# Patient Record
Sex: Female | Born: 1975 | Race: Black or African American | Hispanic: No | Marital: Single | Smoking: Never smoker
Health system: Southern US, Community
[De-identification: ages and names within clinical notes are randomized; demographics above are authoritative.]

## PROBLEM LIST (undated history)

## (undated) DIAGNOSIS — E7800 Pure hypercholesterolemia, unspecified: Secondary | ICD-10-CM

## (undated) DIAGNOSIS — F419 Anxiety disorder, unspecified: Secondary | ICD-10-CM

## (undated) DIAGNOSIS — I10 Essential (primary) hypertension: Secondary | ICD-10-CM

## (undated) DIAGNOSIS — Z889 Allergy status to unspecified drugs, medicaments and biological substances status: Secondary | ICD-10-CM

## (undated) DIAGNOSIS — T783XXA Angioneurotic edema, initial encounter: Secondary | ICD-10-CM

## (undated) HISTORY — DX: Morbid (severe) obesity due to excess calories: E66.01

## (undated) HISTORY — DX: Angioneurotic edema, initial encounter: T78.3XXA

## (undated) HISTORY — DX: Pure hypercholesterolemia, unspecified: E78.00

## (undated) HISTORY — DX: Allergy status to unspecified drugs, medicaments and biological substances: Z88.9

## (undated) HISTORY — DX: Essential (primary) hypertension: I10

## (undated) HISTORY — DX: Anxiety disorder, unspecified: F41.9

---

## 2011-06-01 ENCOUNTER — Other Ambulatory Visit: Payer: Self-pay

## 2011-06-01 ENCOUNTER — Emergency Department (HOSPITAL_COMMUNITY)
Admission: EM | Admit: 2011-06-01 | Discharge: 2011-06-02 | Disposition: A | Discharge status: Home / Self Care | Payer: Self-pay | Attending: Emergency Medicine | Admitting: Emergency Medicine

## 2011-06-01 ENCOUNTER — Encounter: Payer: Self-pay | Admitting: Emergency Medicine

## 2011-06-01 DIAGNOSIS — R079 Chest pain, unspecified: Secondary | ICD-10-CM | POA: Insufficient documentation

## 2011-06-01 NOTE — ED Notes (Signed)
Pt states having chest tightness and sob onset Friday. Pt states pain has increased . Has had panic attacks in past but states this feels different.

## 2011-06-02 NOTE — ED Provider Notes (Signed)
History     CSN: [id number] Arrival date & time: 06/01/2011  5:49 PM   First MD Initiated Contact with Patient 06/01/11 2348      Chief Complaint  Patient presents with  . Chest Pain    (Consider location/radiation/quality/duration/timing/severity/associated sxs/prior treatment) Patient is a 35 y.o. female presenting with chest pain. The history is provided by the patient.  Chest Pain Episode onset: 4 days ago. Chest pain occurs intermittently. The chest pain is unchanged. The pain is associated with breathing. The severity of the pain is mild. The quality of the pain is described as aching. The pain does not radiate. Exacerbated by: No known provocation. Pertinent negatives for primary symptoms include no fever, no fatigue, no syncope, no shortness of breath, no cough, no wheezing, no nausea, no vomiting and no dizziness.  Pertinent negatives for associated symptoms include no diaphoresis, no numbness and no orthopnea. She tried nothing for the symptoms. Risk factors include no known risk factors (no oral contraceptives).  Pertinent negatives for past medical history include no PE.     Past Medical History  Diagnosis Date  . Asthma     Past Surgical History  Procedure Date  . Cesarean section     History reviewed. No pertinent family history.  History  Substance Use Topics  . Smoking status: Not on file  . Smokeless tobacco: Not on file  . Alcohol Use: No    OB History    Grav Para Term Preterm Abortions TAB SAB Ect Mult Living                  Review of Systems  Constitutional: Negative for fever, diaphoresis and fatigue.  Respiratory: Negative for cough, shortness of breath and wheezing.   Cardiovascular: Positive for chest pain. Negative for orthopnea and syncope.  Gastrointestinal: Negative for nausea and vomiting.  Neurological: Negative for dizziness and numbness.  All other systems reviewed and are negative.    Allergies  Review of patient's  allergies indicates no known allergies.  Home Medications   Current Outpatient Rx  Name Route Sig Dispense Refill  . IBUPROFEN 200 MG PO TABS Oral Take 400 mg by mouth every 6 (six) hours as needed. For pain.       BP 129/72  Pulse 81  Temp 98.2 F (36.8 C)  Resp 28  SpO2 98%  LMP 05/28/2011  Physical Exam  Constitutional: She is oriented to person, place, and time. She appears well-developed and well-nourished.  HENT:  Head: Normocephalic and atraumatic.  Eyes: EOM are normal. Pupils are equal, round, and reactive to light.  Neck: Normal range of motion. Neck supple.  Cardiovascular: Normal rate and regular rhythm.   Pulmonary/Chest: Effort normal and breath sounds normal.       Chest nontender to palpation  Abdominal: Soft. Bowel sounds are normal.  Musculoskeletal: Normal range of motion.  Neurological: She is alert and oriented to person, place, and time. No cranial nerve deficit. She exhibits normal muscle tone.  Skin: Skin is warm and dry.  Psychiatric: She has a normal mood and affect. Her behavior is normal. Judgment and thought content normal.    ED Course  Procedures (including critical care time)  Labs Reviewed - No data to display No results found.   1. Chest pain       MDM  Nonspecific chest pain, pertinent negative, no GI symptoms. Doubt PE, pneumonia, metabolic instability or occult infection.        Flint Melter, MD  06/02/11 0008 

## 2016-10-06 ENCOUNTER — Encounter: Payer: Self-pay | Admitting: Allergy and Immunology

## 2016-10-06 ENCOUNTER — Ambulatory Visit (INDEPENDENT_AMBULATORY_CARE_PROVIDER_SITE_OTHER): Payer: BLUE CROSS/BLUE SHIELD | Admitting: Allergy and Immunology

## 2016-10-06 VITALS — BP 152/92 | HR 88 | Temp 98.6°F | Resp 18 | Ht 67.0 in | Wt 268.0 lb

## 2016-10-06 DIAGNOSIS — H1010 Acute atopic conjunctivitis, unspecified eye: Secondary | ICD-10-CM | POA: Diagnosis not present

## 2016-10-06 DIAGNOSIS — J309 Allergic rhinitis, unspecified: Secondary | ICD-10-CM | POA: Diagnosis not present

## 2016-10-06 DIAGNOSIS — T7840XA Allergy, unspecified, initial encounter: Secondary | ICD-10-CM

## 2016-10-06 DIAGNOSIS — L500 Allergic urticaria: Secondary | ICD-10-CM

## 2016-10-06 MED ORDER — EPINEPHRINE 0.3 MG/0.3ML IJ SOAJ: 0.3000 mg | Freq: Once | INTRAMUSCULAR | 2 refills | Status: AC

## 2016-10-06 MED ORDER — MONTELUKAST SODIUM 10 MG PO TABS: 10.0000 mg | ORAL_TABLET | Freq: Every day | ORAL | 5 refills | Status: DC

## 2016-10-06 MED ORDER — MOMETASONE FUROATE 0.1 % EX OINT: TOPICAL_OINTMENT | Freq: Every day | CUTANEOUS | 5 refills | Status: AC

## 2016-10-06 NOTE — Progress Notes (Signed)
NEW PATIENT NOTE  Referring Provider: No ref. provider found Primary Provider: No PCP Per Patient Date of office visit: 10/06/2016    Subjective:   Chief Complaint:  Peggy Foster (DOB: 06/05/1976) is a 41 y.o. female who presents to the clinic on 10/06/2016 with a chief complaint of Allergic Reaction (on 09/25/16); Angioedema; and Rash .  HPI: Peggy Foster presents to this clinic in evaluation of a allergic reaction that occurred on 09/25/2016. Apparently she woke up that morning with very significant swelling of her face mostly involving her periorbital region and her lips. In association with the swelling was a very red body and intense itching of her body. She also had a flare of her eczema involving her neck and she developed scale around her eyes. Her reaction lasted approximately 2 days for her facial swelling and her red body remained for approximately 5 days and she is still little bit itchy today.Marland Kitchen She went to the urgent care center that day and was treated with an injection and prednisone.  The day prior to her reaction she had diarrhea. She had at least 8 episodes of diarrhea and her stomach was just upset in general without any nausea or vomiting. She did not have any fever associated with this issue. She actually developed a little bit of itching the night before her reaction and she took some Benadryl and then went to sleep and then subsequently ended up with her reaction the next morning.  There was no other associated systemic or constitutional symptoms. There was no obvious trigger giving rise to this issue.  She does have a history of eczema that intermittently flares involving her arms and neck that she treats with over-the-counter hydrocortisone which is not helping very much. As noted above she did have a flare of her neck eczema with her most recent allergic reaction. She also has a history of springtime nasal congestion and runny nose that has responsed quite well to the  use of Zyrtec. She has a very distant history of childhood asthma but has not used a bronchodilator in many many years.  Past Medical History:  Diagnosis Date  . Angio-edema   . Asthma   . H/O seasonal allergies     Past Surgical History:  Procedure Laterality Date  . CESAREAN SECTION      Allergies as of 10/06/2016      Reactions   Ciprofloxacin Hives, Nausea And Vomiting      Medication List      cetirizine 10 MG tablet Commonly known as:  ZYRTEC Take 10 mg by mouth daily.       Review of systems negative except as noted in HPI / PMHx or noted below:  Review of Systems  Constitutional: Negative.   HENT: Negative.   Eyes: Negative.   Respiratory: Negative.   Cardiovascular: Negative.   Gastrointestinal: Negative.   Genitourinary: Negative.   Musculoskeletal: Negative.   Skin: Negative.   Neurological: Negative.   Endo/Heme/Allergies: Negative.   Psychiatric/Behavioral: Negative.     Family History  Problem Relation Age of Onset  . Hypertension Mother   . Hypertension Father   . Asthma Maternal Grandmother   . Asthma Paternal Grandfather   . Diabetes Paternal Grandfather     Social History   Social History  . Marital status: Single    Spouse name: N/A  . Number of children: N/A  . Years of education: N/A   Occupational History  . Not on file.  Social History Main Topics  . Smoking status: Never Smoker  . Smokeless tobacco: Never Used  . Alcohol use No  . Drug use: No  . Sexual activity: Not on file   Other Topics Concern  . Not on file   Social History Narrative  . No narrative on file    Environmental and Social history  Lives in a house with a dry environment, no animals located inside the household, carpeting in the bedroom, plastic on the bed but not the pillow, and no smokers located inside the household. She works in a Teacher, adult education at [employer].  Objective:   Vitals:   10/06/16 0847  BP: (!) 152/92  Pulse: 88  Resp: 18  Temp:  98.6 F (37 C)   Height: 5\' 7"  (170.2 cm) Weight: 268 lb (121.6 kg)  Physical Exam  Constitutional: She is well-developed, well-nourished, and in no distress.  HENT:  Head: Normocephalic. Head is without right periorbital erythema and without left periorbital erythema.  Right Ear: Tympanic membrane, external ear and ear canal normal.  Left Ear: Tympanic membrane, external ear and ear canal normal.  Nose: Nose normal. No mucosal edema or rhinorrhea.  Mouth/Throat: Uvula is midline, oropharynx is clear and moist and mucous membranes are normal. No oropharyngeal exudate.  Eyes: Conjunctivae and lids are normal. Pupils are equal, round, and reactive to light.  Neck: Trachea normal. No tracheal tenderness present. No tracheal deviation present. No thyromegaly present.  Cardiovascular: Normal rate, regular rhythm, S1 normal, S2 normal and normal heart sounds.   No murmur heard. Pulmonary/Chest: Effort normal and breath sounds normal. No stridor. No tachypnea. No respiratory distress. She has no wheezes. She has no rales. She exhibits no tenderness.  Abdominal: Soft. She exhibits no distension and no mass. There is no hepatosplenomegaly. There is no tenderness. There is no rebound and no guarding.  Musculoskeletal: She exhibits no edema or tenderness.  Lymphadenopathy:       Head (right side): No tonsillar adenopathy present.       Head (left side): No tonsillar adenopathy present.    She has no cervical adenopathy.    She has no axillary adenopathy.  Neurological: She is alert. Gait normal.  Skin: Rash (scaly, lichenified, erythematous skin posterior neck and arms bilaterally) noted. She is not diaphoretic. No erythema. No pallor. Nails show no clubbing.  Psychiatric: Mood and affect normal.    Diagnostics: Allergy skin tests were performed. She demonstrated hypersensitivity to cat, dog, and tree pollen. She did not demonstrate any hypersensitivity to a screening panel of  foods.  Assessment and Plan:    1. Allergic reaction, initial encounter   2. Allergic urticaria   3. Allergic rhinoconjunctivitis     1. Allergen avoidance measures  2. AUVI-Q 0.3, Benadryl, M.D./ER evaluation for allergic reaction  3. For the next 4 weeks utilize the following medications:   A. cetirizine 10 mg one time per day  B. montelukast 10 mg one time per day  4. If needed:   A. mometasone 0.1% ointment applied to eczema one time per day   5. Blood - alpha gal panel, CBC w/Diff, CMP  6. Further evaluation and treatment? Yes, if recurrent  7. Return to clinic summer 2018 or earlier if problem.   The etiology of Merrell's allergic reaction is not entirely clear. This may be based upon atopic disease and one of the triggers giving rise to this reaction may have been exposure to tree pollen but is well she did have diarrhea  the day preceding her reaction and her immunological hyperreactivity may have been triggered off by an infectious disease residing in her gut. For now we will have her perform allergen avoidance measures and consistently use a H1 receptor blocker and a leukotriene modifier and have her obtain blood tests looking at major organ function and possible alpha gal syndrome. For her atopic dermatitis she can use topical steroids. She'll keep in contact with me noting her response to this approach. I'll see her back in this clinic in the summer or earlier if there is a problem.  Allena Katz, MD Allergy / Immunology [place]

## 2016-10-06 NOTE — Patient Instructions (Addendum)
  1. Allergen avoidance measures  2. AUVI-Q 0.3, Benadryl, M.D./ER evaluation for allergic reaction  3. For the next 4 weeks utilize the following medications:   A. cetirizine 10 mg one time per day  B. montelukast 10 mg one time per day  4. If needed:   A. mometasone 0.1% ointment applied to eczema one time per day   5. Blood - alpha gal panel, CBC w/Diff, CMP  6. Further evaluation and treatment? Yes, if recurrent  7. Return to clinic summer 2018 or earlier if problem.

## 2017-12-16 ENCOUNTER — Encounter: Payer: Self-pay | Admitting: Family Medicine

## 2017-12-16 ENCOUNTER — Ambulatory Visit (INDEPENDENT_AMBULATORY_CARE_PROVIDER_SITE_OTHER): Payer: BLUE CROSS/BLUE SHIELD | Admitting: Family Medicine

## 2017-12-16 VITALS — BP 144/96 | HR 88 | Temp 99.3°F | Resp 16 | Ht 68.0 in | Wt 269.6 lb

## 2017-12-16 DIAGNOSIS — F419 Anxiety disorder, unspecified: Secondary | ICD-10-CM | POA: Diagnosis not present

## 2017-12-16 DIAGNOSIS — R030 Elevated blood-pressure reading, without diagnosis of hypertension: Secondary | ICD-10-CM

## 2017-12-16 DIAGNOSIS — Z8639 Personal history of other endocrine, nutritional and metabolic disease: Secondary | ICD-10-CM

## 2017-12-16 MED ORDER — LISINOPRIL-HYDROCHLOROTHIAZIDE 10-12.5 MG PO TABS: 1.0000 | ORAL_TABLET | Freq: Every day | ORAL | 1 refills | Status: DC

## 2017-12-16 MED ORDER — SERTRALINE HCL 50 MG PO TABS: 50.0000 mg | ORAL_TABLET | Freq: Every day | ORAL | 1 refills | Status: DC

## 2017-12-16 MED ORDER — ALPRAZOLAM 0.25 MG PO TABS
0.2500 mg | ORAL_TABLET | Freq: Two times a day (BID) | ORAL | 0 refills | Status: AC | PRN
Start: 2017-12-16 — End: ?

## 2017-12-16 NOTE — Patient Instructions (Signed)
Start the sertraline 1/2 tablet (25 mg) for the first week. Increase to the full tablet (50 mg) after that if you are doing well.  Take the Xanax if needed. Do not drink alcohol with these medications.   Start the BP medication once daily. Eat a low sodium diet.  Check your BP at home daily and keep a record of the readings. Goal BP is <130/80.  Bring in your BP machine and readings in 2 weeks.    DASH Eating Plan DASH stands for "Dietary Approaches to Stop Hypertension." The DASH eating plan is a healthy eating plan that has been shown to reduce high blood pressure (hypertension). It may also reduce your risk for type 2 diabetes, heart disease, and stroke. The DASH eating plan may also help with weight loss. What are tips for following this plan? General guidelines  Avoid eating more than 2,300 mg (milligrams) of salt (sodium) a day. If you have hypertension, you may need to reduce your sodium intake to 1,500 mg a day.  Limit alcohol intake to no more than 1 drink a day for nonpregnant women and 2 drinks a day for men. One drink equals 12 oz of beer, 5 oz of wine, or 1 oz of hard liquor.  Work with your health care provider to maintain a healthy body weight or to lose weight. Ask what an ideal weight is for you.  Get at least 30 minutes of exercise that causes your heart to beat faster (aerobic exercise) most days of the week. Activities may include walking, swimming, or biking.  Work with your health care provider or diet and nutrition specialist (dietitian) to adjust your eating plan to your individual calorie needs. Reading food labels  Check food labels for the amount of sodium per serving. Choose foods with less than 5 percent of the Daily Value of sodium. Generally, foods with less than 300 mg of sodium per serving fit into this eating plan.  To find whole grains, look for the word "whole" as the first word in the ingredient list. Shopping  Buy products labeled as "low-sodium" or  "no salt added."  Buy fresh foods. Avoid canned foods and premade or frozen meals. Cooking  Avoid adding salt when cooking. Use salt-free seasonings or herbs instead of table salt or sea salt. Check with your health care provider or pharmacist before using salt substitutes.  Do not fry foods. Cook foods using healthy methods such as baking, boiling, grilling, and broiling instead.  Cook with heart-healthy oils, such as olive, canola, soybean, or sunflower oil. Meal planning   Eat a balanced diet that includes: ? 5 or more servings of fruits and vegetables each day. At each meal, try to fill half of your plate with fruits and vegetables. ? Up to 6-8 servings of whole grains each day. ? Less than 6 oz of lean meat, poultry, or fish each day. A 3-oz serving of meat is about the same size as a deck of cards. One egg equals 1 oz. ? 2 servings of low-fat dairy each day. ? A serving of nuts, seeds, or beans 5 times each week. ? Heart-healthy fats. Healthy fats called Omega-3 fatty acids are found in foods such as flaxseeds and coldwater fish, like sardines, salmon, and mackerel.  Limit how much you eat of the following: ? Canned or prepackaged foods. ? Food that is high in trans fat, such as fried foods. ? Food that is high in saturated fat, such as fatty meat. ?  Sweets, desserts, sugary drinks, and other foods with added sugar. ? Full-fat dairy products.  Do not salt foods before eating.  Try to eat at least 2 vegetarian meals each week.  Eat more home-cooked food and less restaurant, buffet, and fast food.  When eating at a restaurant, ask that your food be prepared with less salt or no salt, if possible. What foods are recommended? The items listed may not be a complete list. Talk with your dietitian about what dietary choices are best for you. Grains Whole-grain or whole-wheat bread. Whole-grain or whole-wheat pasta. Brown rice. Modena Morrow. Bulgur. Whole-grain and low-sodium  cereals. Pita bread. Low-fat, low-sodium crackers. Whole-wheat flour tortillas. Vegetables Fresh or frozen vegetables (raw, steamed, roasted, or grilled). Low-sodium or reduced-sodium tomato and vegetable juice. Low-sodium or reduced-sodium tomato sauce and tomato paste. Low-sodium or reduced-sodium canned vegetables. Fruits All fresh, dried, or frozen fruit. Canned fruit in natural juice (without added sugar). Meat and other protein foods Skinless chicken or [place]. Ground chicken or [place]. Pork with fat trimmed off. Fish and seafood. Egg whites. Dried beans, peas, or lentils. Unsalted nuts, nut butters, and seeds. Unsalted canned beans. Lean cuts of beef with fat trimmed off. Low-sodium, lean deli meat. Dairy Low-fat (1%) or fat-free (skim) milk. Fat-free, low-fat, or reduced-fat cheeses. Nonfat, low-sodium ricotta or cottage cheese. Low-fat or nonfat yogurt. Low-fat, low-sodium cheese. Fats and oils Soft margarine without trans fats. Vegetable oil. Low-fat, reduced-fat, or light mayonnaise and salad dressings (reduced-sodium). Canola, safflower, olive, soybean, and sunflower oils. Avocado. Seasoning and other foods Herbs. Spices. Seasoning mixes without salt. Unsalted popcorn and pretzels. Fat-free sweets. What foods are not recommended? The items listed may not be a complete list. Talk with your dietitian about what dietary choices are best for you. Grains Baked goods made with fat, such as croissants, muffins, or some breads. Dry pasta or rice meal packs. Vegetables Creamed or fried vegetables. Vegetables in a cheese sauce. Regular canned vegetables (not low-sodium or reduced-sodium). Regular canned tomato sauce and paste (not low-sodium or reduced-sodium). Regular tomato and vegetable juice (not low-sodium or reduced-sodium). Angie Fava. Olives. Fruits Canned fruit in a light or heavy syrup. Fried fruit. Fruit in cream or butter sauce. Meat and other protein foods Fatty cuts of meat. Ribs.  Fried meat. Berniece Salines. Sausage. Bologna and other processed lunch meats. Salami. Fatback. Hotdogs. Bratwurst. Salted nuts and seeds. Canned beans with added salt. Canned or smoked fish. Whole eggs or egg yolks. Chicken or [place] with skin. Dairy Whole or 2% milk, cream, and half-and-half. Whole or full-fat cream cheese. Whole-fat or sweetened yogurt. Full-fat cheese. Nondairy creamers. Whipped toppings. Processed cheese and cheese spreads. Fats and oils Butter. Stick margarine. Lard. Shortening. Ghee. Bacon fat. Tropical oils, such as coconut, palm kernel, or palm oil. Seasoning and other foods Salted popcorn and pretzels. Onion salt, garlic salt, seasoned salt, table salt, and sea salt. Worcestershire sauce. Tartar sauce. Barbecue sauce. Teriyaki sauce. Soy sauce, including reduced-sodium. Steak sauce. Canned and packaged gravies. Fish sauce. Oyster sauce. Cocktail sauce. Horseradish that you find on the shelf. Ketchup. Mustard. Meat flavorings and tenderizers. Bouillon cubes. Hot sauce and Tabasco sauce. Premade or packaged marinades. Premade or packaged taco seasonings. Relishes. Regular salad dressings. Where to find more information:  National Heart, Lung, and [place]: https://wilson-eaton.com/  American Heart Association: www.heart.org Summary  The DASH eating plan is a healthy eating plan that has been shown to reduce high blood pressure (hypertension). It may also reduce your risk for type 2 diabetes,  heart disease, and stroke.  With the DASH eating plan, you should limit salt (sodium) intake to 2,300 mg a day. If you have hypertension, you may need to reduce your sodium intake to 1,500 mg a day.  When on the DASH eating plan, aim to eat more fresh fruits and vegetables, whole grains, lean proteins, low-fat dairy, and heart-healthy fats.  Work with your health care provider or diet and nutrition specialist (dietitian) to adjust your eating plan to your individual calorie needs. This  information is not intended to replace advice given to you by your health care provider. Make sure you discuss any questions you have with your health care provider. Document Released: 05/28/2011 Document Revised: 06/01/2016 Document Reviewed: 06/01/2016 Elsevier Interactive Patient Education  [employer].

## 2017-12-16 NOTE — Progress Notes (Signed)
   Subjective:    Patient ID: Peggy Foster, female    DOB: [birth year], 42 y.o.   MRN: [id number]  HPI Chief Complaint  Patient presents with  . NP    NP elevated BP   She is new to the practice and here to establish care.  She lived in [place] prior to moving here.  Previous medical care: Dr. Criss Rosales   States she has been concerned about her BP being elevated for the past year or so. She does not check it at home. Has never been on medication.  Family history of HTN in mother in her brother.   Complains of anxiety attacks and having intermittent episodes of dizziness, heart racing, palms sweating, increased breathing that has been ongoing for a couple of years.  States she has been evaluated for chest pain before and told that her symptoms were related to anxiety. Has been prescribed Xanax in the past and states this improved her symptoms.  She is requesting help with her anxiety. States it is affecting her job an her home life.   Denies smoking, drug use, or acohol.   Works at [employer]. Single, has one child. Supportive mother.   Denies fever, chills, vision changes, chest pain, shortness of breath, cough, orthopnea, abdominal pain, back pain, N/V/D, urinary symptoms, LE edema.   Reviewed allergies, medications, past medical, surgical, family, and social history.   Review of Systems Pertinent positives and negatives in the history of present illness.     Objective:   Physical Exam BP (!) 144/96   Pulse 88   Temp 99.3 F (37.4 C) (Oral)   Resp 16   Ht 5\' 8"  (1.727 m)   Wt 269 lb 9.6 oz (122.3 kg)   SpO2 98%   BMI 40.99 kg/m   Alert and in no distress. Pharyngeal area is normal. Neck is supple without adenopathy or thyromegaly. Cardiac exam shows a regular sinus rhythm without murmurs or gallops. Lungs are clear to auscultation. PERRLA, EOMs intact, CN intact, extremities without edema, normal pulses. Skin is warm and dry, no pallor.        Assessment & Plan:    Anxiety - Plan: sertraline (ZOLOFT) 50 MG tablet, ALPRAZolam (XANAX) 0.25 MG tablet, CBC with Differential/Platelet, Comprehensive metabolic panel, TSH  Elevated blood pressure reading without diagnosis of hypertension - Plan: lisinopril-hydrochlorothiazide (PRINZIDE,ZESTORETIC) 10-12.5 MG tablet, CBC with Differential/Platelet, Comprehensive metabolic panel, TSH  Morbid obesity ([place]) - Plan: CBC with Differential/Platelet, Comprehensive metabolic panel, TSH, Hemoglobin A1c  History of hyperlipidemia - Plan: Lipid panel  Pleasant appearing female in no acute distress. She is anxious appearing and tearful. She would like to try medication and counseling for anxiety.  Sertraline and alprazolam prescribed. She is aware that alprazolam is for short term treatment only and that she may need this during the first the week or two.  She will call and schedule with a counselor.  Blood pressure is elevated and I will start her on medication. She will buy a BP cuff and start checking her BP at home. Return in 2 weeks with machine, readings and follow up on anxiety as well.  Handout given on DASH diet.  Check lipid panel due to history of hyperlipidemia.

## 2017-12-17 LAB — HEMOGLOBIN A1C
Est. average glucose Bld gHb Est-mCnc: 114 mg/dL
HEMOGLOBIN A1C: 5.6 % (ref 4.8–5.6)

## 2017-12-17 LAB — CBC WITH DIFFERENTIAL/PLATELET
BASOS: 0 %
Basophils Absolute: 0.1 10*3/uL (ref 0.0–0.2)
EOS (ABSOLUTE): 0.2 10*3/uL (ref 0.0–0.4)
EOS: 1 %
HEMATOCRIT: 38.3 % (ref 34.0–46.6)
HEMOGLOBIN: 12.6 g/dL (ref 11.1–15.9)
Immature Grans (Abs): 0.1 10*3/uL (ref 0.0–0.1)
Immature Granulocytes: 1 %
Lymphocytes Absolute: 3.5 10*3/uL — ABNORMAL HIGH (ref 0.7–3.1)
Lymphs: 27 %
MCH: 27.1 pg (ref 26.6–33.0)
MCHC: 32.9 g/dL (ref 31.5–35.7)
MCV: 82 fL (ref 79–97)
MONOCYTES: 6 %
Monocytes Absolute: 0.7 10*3/uL (ref 0.1–0.9)
NEUTROS ABS: 8.6 10*3/uL — AB (ref 1.4–7.0)
Neutrophils: 65 %
Platelets: 556 10*3/uL — ABNORMAL HIGH (ref 150–450)
RBC: 4.65 x10E6/uL (ref 3.77–5.28)
RDW: 14.2 % (ref 12.3–15.4)
WBC: 13.1 10*3/uL — ABNORMAL HIGH (ref 3.4–10.8)

## 2017-12-17 LAB — LIPID PANEL
Chol/HDL Ratio: 4 ratio (ref 0.0–4.4)
Cholesterol, Total: 179 mg/dL (ref 100–199)
HDL: 45 mg/dL (ref >39)
LDL Calculated: 118 mg/dL — ABNORMAL HIGH (ref 0–99)
Triglycerides: 80 mg/dL (ref 0–149)
VLDL CHOLESTEROL CAL: 16 mg/dL (ref 5–40)

## 2017-12-17 LAB — COMPREHENSIVE METABOLIC PANEL
ALBUMIN: 4.5 g/dL (ref 3.5–5.5)
ALK PHOS: 109 IU/L (ref 39–117)
ALT: 15 IU/L (ref 0–32)
AST: 18 IU/L (ref 0–40)
Albumin/Globulin Ratio: 1.3 (ref 1.2–2.2)
BILIRUBIN TOTAL: 0.2 mg/dL (ref 0.0–1.2)
BUN / CREAT RATIO: 11 (ref 9–23)
BUN: 8 mg/dL (ref 6–24)
CO2: 21 mmol/L (ref 20–29)
CREATININE: 0.71 mg/dL (ref 0.57–1.00)
Calcium: 9.5 mg/dL (ref 8.7–10.2)
Chloride: 107 mmol/L — ABNORMAL HIGH (ref 96–106)
GFR calc Af Amer: 123 mL/min/{1.73_m2} (ref >59)
GFR calc non Af Amer: 107 mL/min/{1.73_m2} (ref >59)
GLOBULIN, TOTAL: 3.4 g/dL (ref 1.5–4.5)
GLUCOSE: 90 mg/dL (ref 65–99)
Potassium: 4.1 mmol/L (ref 3.5–5.2)
Sodium: 137 mmol/L (ref 134–144)
TOTAL PROTEIN: 7.9 g/dL (ref 6.0–8.5)

## 2017-12-17 LAB — TSH: TSH: 0.942 u[IU]/mL (ref 0.450–4.500)

## 2017-12-29 ENCOUNTER — Encounter: Payer: Self-pay | Admitting: Family Medicine

## 2017-12-29 ENCOUNTER — Ambulatory Visit (INDEPENDENT_AMBULATORY_CARE_PROVIDER_SITE_OTHER): Payer: BLUE CROSS/BLUE SHIELD | Admitting: Family Medicine

## 2017-12-29 VITALS — BP 150/90 | HR 90 | Wt 266.0 lb

## 2017-12-29 DIAGNOSIS — E7800 Pure hypercholesterolemia, unspecified: Secondary | ICD-10-CM

## 2017-12-29 DIAGNOSIS — D473 Essential (hemorrhagic) thrombocythemia: Secondary | ICD-10-CM | POA: Diagnosis not present

## 2017-12-29 DIAGNOSIS — I10 Essential (primary) hypertension: Secondary | ICD-10-CM | POA: Diagnosis not present

## 2017-12-29 DIAGNOSIS — D75839 Thrombocytosis, unspecified: Secondary | ICD-10-CM

## 2017-12-29 DIAGNOSIS — F419 Anxiety disorder, unspecified: Secondary | ICD-10-CM

## 2017-12-29 HISTORY — DX: Anxiety disorder, unspecified: F41.9

## 2017-12-29 HISTORY — DX: Pure hypercholesterolemia, unspecified: E78.00

## 2017-12-29 HISTORY — DX: Essential (primary) hypertension: I10

## 2017-12-29 HISTORY — DX: Morbid (severe) obesity due to excess calories: E66.01

## 2017-12-29 NOTE — Patient Instructions (Signed)
Continue monitoring your BP at home. Report back your average readings in 2 weeks.

## 2017-12-29 NOTE — Progress Notes (Signed)
   Subjective:    Patient ID: Peggy Foster, female    DOB: 07-17-1975, 42 y.o.   MRN: [id number]  HPI Chief Complaint  Patient presents with  . 2 week follow-up    2 week follow-up on bp and aniexty. bp running lowest- 113/ 87  highest-147/106.   She is here to follow up on HTN and anxiety.  Her WBC count and platelets were elevated 2 weeks ago as well.   Started her on lisinopril-HCTZ 2 weeks ago. This is the first time she has taken medication for HTN. Reports history of high BP for the past year or longer.  Checking her BP at home and seeing readings as low as 119/77, 121/75  Diet - cooking more and eating out less Exercise- walking more Family history of HTN.   Diabetes screening was negative.   She was having anxiety attacks so we started her on sertraline and she has noticed significant improvement.  Is not taking alprazolam regularly - has only needed this a couple of times.   Has not scheduled to see a counselor   Depression screen [facility] 2/9 12/29/2017  Decreased Interest 0  Down, Depressed, Hopeless 0  PHQ - 2 Score 0   Denies fever, chills, dizziness, chest pain, palpitations, shortness of breath, abdominal pain, N/V/D, urinary symptoms, LE edema.   Reviewed allergies, medications, past medical, surgical, family, and social history.    Review of Systems Pertinent positives and negatives in the history of present illness.     Objective:   Physical Exam BP (!) 150/90 Comment: office cuff  Pulse 90   Wt 266 lb (120.7 kg)   BMI 40.45 kg/m   Alert and oriented and in no acute distress.       Assessment & Plan:  Essential hypertension  Anxiety  Morbid obesity (HCC)  Elevated LDL cholesterol level  Thrombocytosis (HCC) - Plan: CBC with Differential/Platelet  Her blood pressure is improving on medication.  We will give this more time.  Counseling again on eating a low-sodium diet and increasing physical activity. She will continue to keep an eye on  her blood pressure at home.  Anxiety appears to be much improved on sertraline. Encouraged her to call and schedule a counseling appointment. Counseling done on improving cholesterol including diet and lifestyle Unclear etiology for elevated white blood count and thrombocytosis 2 weeks ago.  She does report that she was feeling ill the week prior to having her labs checked.  We will recheck this today. Asked her to send me blood pressure readings again in 2 weeks.  Follow-up here if readings are not closer to goal range.

## 2017-12-30 LAB — CBC WITH DIFFERENTIAL/PLATELET
Basophils Absolute: 0 10*3/uL (ref 0.0–0.2)
Basos: 0 %
EOS (ABSOLUTE): 0.1 10*3/uL (ref 0.0–0.4)
Eos: 1 %
Hematocrit: 36.5 % (ref 34.0–46.6)
Hemoglobin: 12.2 g/dL (ref 11.1–15.9)
IMMATURE GRANS (ABS): 0 10*3/uL (ref 0.0–0.1)
IMMATURE GRANULOCYTES: 0 %
LYMPHS: 25 %
Lymphocytes Absolute: 2.4 10*3/uL (ref 0.7–3.1)
MCH: 27.7 pg (ref 26.6–33.0)
MCHC: 33.4 g/dL (ref 31.5–35.7)
MCV: 83 fL (ref 79–97)
Monocytes Absolute: 0.6 10*3/uL (ref 0.1–0.9)
Monocytes: 6 %
NEUTROS PCT: 68 %
Neutrophils Absolute: 6.5 10*3/uL (ref 1.4–7.0)
Platelets: 460 10*3/uL — ABNORMAL HIGH (ref 150–450)
RBC: 4.41 x10E6/uL (ref 3.77–5.28)
RDW: 15.1 % (ref 12.3–15.4)
WBC: 9.7 10*3/uL (ref 3.4–10.8)

## 2018-01-11 ENCOUNTER — Encounter: Payer: Self-pay | Admitting: Family Medicine

## 2018-02-08 ENCOUNTER — Other Ambulatory Visit: Payer: Self-pay | Admitting: Family Medicine

## 2018-02-08 DIAGNOSIS — R030 Elevated blood-pressure reading, without diagnosis of hypertension: Secondary | ICD-10-CM

## 2018-02-08 DIAGNOSIS — F419 Anxiety disorder, unspecified: Secondary | ICD-10-CM

## 2018-02-23 ENCOUNTER — Telehealth: Payer: Self-pay | Admitting: Family Medicine

## 2018-02-23 ENCOUNTER — Encounter: Payer: Self-pay | Admitting: Family Medicine

## 2018-02-23 ENCOUNTER — Ambulatory Visit (INDEPENDENT_AMBULATORY_CARE_PROVIDER_SITE_OTHER): Payer: BLUE CROSS/BLUE SHIELD | Admitting: Family Medicine

## 2018-02-23 VITALS — BP 140/90 | HR 81 | Wt 270.4 lb

## 2018-02-23 DIAGNOSIS — F419 Anxiety disorder, unspecified: Secondary | ICD-10-CM | POA: Diagnosis not present

## 2018-02-23 DIAGNOSIS — R05 Cough: Secondary | ICD-10-CM | POA: Diagnosis not present

## 2018-02-23 DIAGNOSIS — R058 Other specified cough: Secondary | ICD-10-CM

## 2018-02-23 DIAGNOSIS — I10 Essential (primary) hypertension: Secondary | ICD-10-CM | POA: Diagnosis not present

## 2018-02-23 DIAGNOSIS — T464X5A Adverse effect of angiotensin-converting-enzyme inhibitors, initial encounter: Secondary | ICD-10-CM

## 2018-02-23 MED ORDER — LOSARTAN POTASSIUM-HCTZ 50-12.5 MG PO TABS: 1.0000 | ORAL_TABLET | Freq: Every day | ORAL | 2 refills | Status: DC

## 2018-02-23 NOTE — Telephone Encounter (Signed)
  Fax from [facility] concerning Losartan/hct 50-12.5 They want to break into 2 separate Rx's due to medication being on backorder

## 2018-02-23 NOTE — Patient Instructions (Addendum)
Stop the lisinopril-HCTZ due to cough. Your cough should resolve in the next couple of weeks.  Start on Losartan-HCTZ for your blood pressure. Continue checking it at home. Call or message me in 2 weeks and give me your readings.  Return in 4 weeks for a physical and blood pressure follow up.   Start using the free app called My Fitness Pal. Cut back on portion sizes. Eat only one small plate and wait at least 10 minutes before going back for seconds.   Increase your physical activity.   You will receive a call from [employer] about their upcoming weight loss study.      DASH Eating Plan DASH stands for "Dietary Approaches to Stop Hypertension." The DASH eating plan is a healthy eating plan that has been shown to reduce high blood pressure (hypertension). It may also reduce your risk for type 2 diabetes, heart disease, and stroke. The DASH eating plan may also help with weight loss. What are tips for following this plan? General guidelines  Avoid eating more than 2,300 mg (milligrams) of salt (sodium) a day. If you have hypertension, you may need to reduce your sodium intake to 1,500 mg a day.  Limit alcohol intake to no more than 1 drink a day for nonpregnant women and 2 drinks a day for men. One drink equals 12 oz of beer, 5 oz of wine, or 1 oz of hard liquor.  Work with your health care provider to maintain a healthy body weight or to lose weight. Ask what an ideal weight is for you.  Get at least 30 minutes of exercise that causes your heart to beat faster (aerobic exercise) most days of the week. Activities may include walking, swimming, or biking.  Work with your health care provider or diet and nutrition specialist (dietitian) to adjust your eating plan to your individual calorie needs. Reading food labels  Check food labels for the amount of sodium per serving. Choose foods with less than 5 percent of the Daily Value of sodium. Generally, foods with less than 300 mg of sodium  per serving fit into this eating plan.  To find whole grains, look for the word "whole" as the first word in the ingredient list. Shopping  Buy products labeled as "low-sodium" or "no salt added."  Buy fresh foods. Avoid canned foods and premade or frozen meals. Cooking  Avoid adding salt when cooking. Use salt-free seasonings or herbs instead of table salt or sea salt. Check with your health care provider or pharmacist before using salt substitutes.  Do not fry foods. Cook foods using healthy methods such as baking, boiling, grilling, and broiling instead.  Cook with heart-healthy oils, such as olive, canola, soybean, or sunflower oil. Meal planning   Eat a balanced diet that includes: ? 5 or more servings of fruits and vegetables each day. At each meal, try to fill half of your plate with fruits and vegetables. ? Up to 6-8 servings of whole grains each day. ? Less than 6 oz of lean meat, poultry, or fish each day. A 3-oz serving of meat is about the same size as a deck of cards. One egg equals 1 oz. ? 2 servings of low-fat dairy each day. ? A serving of nuts, seeds, or beans 5 times each week. ? Heart-healthy fats. Healthy fats called Omega-3 fatty acids are found in foods such as flaxseeds and coldwater fish, like sardines, salmon, and mackerel.  Limit how much you eat of the following: ?  Canned or prepackaged foods. ? Food that is high in trans fat, such as fried foods. ? Food that is high in saturated fat, such as fatty meat. ? Sweets, desserts, sugary drinks, and other foods with added sugar. ? Full-fat dairy products.  Do not salt foods before eating.  Try to eat at least 2 vegetarian meals each week.  Eat more home-cooked food and less restaurant, buffet, and fast food.  When eating at a restaurant, ask that your food be prepared with less salt or no salt, if possible. What foods are recommended? The items listed may not be a complete list. Talk with your dietitian  about what dietary choices are best for you. Grains Whole-grain or whole-wheat bread. Whole-grain or whole-wheat pasta. Brown rice. [place] Morrow. Bulgur. Whole-grain and low-sodium cereals. Pita bread. Low-fat, low-sodium crackers. Whole-wheat flour tortillas. Vegetables Fresh or frozen vegetables (raw, steamed, roasted, or grilled). Low-sodium or reduced-sodium tomato and vegetable juice. Low-sodium or reduced-sodium tomato sauce and tomato paste. Low-sodium or reduced-sodium canned vegetables. Fruits All fresh, dried, or frozen fruit. Canned fruit in natural juice (without added sugar). Meat and other protein foods Skinless chicken or [place]. Ground chicken or [place]. Pork with fat trimmed off. Fish and seafood. Egg whites. Dried beans, peas, or lentils. Unsalted nuts, nut butters, and seeds. Unsalted canned beans. Lean cuts of beef with fat trimmed off. Low-sodium, lean deli meat. Dairy Low-fat (1%) or fat-free (skim) milk. Fat-free, low-fat, or reduced-fat cheeses. Nonfat, low-sodium ricotta or cottage cheese. Low-fat or nonfat yogurt. Low-fat, low-sodium cheese. Fats and oils Soft margarine without trans fats. Vegetable oil. Low-fat, reduced-fat, or light mayonnaise and salad dressings (reduced-sodium). Canola, safflower, olive, soybean, and sunflower oils. Avocado. Seasoning and other foods Herbs. Spices. Seasoning mixes without salt. Unsalted popcorn and pretzels. Fat-free sweets. What foods are not recommended? The items listed may not be a complete list. Talk with your dietitian about what dietary choices are best for you. Grains Baked goods made with fat, such as croissants, muffins, or some breads. Dry pasta or rice meal packs. Vegetables Creamed or fried vegetables. Vegetables in a cheese sauce. Regular canned vegetables (not low-sodium or reduced-sodium). Regular canned tomato sauce and paste (not low-sodium or reduced-sodium). Regular tomato and vegetable juice (not low-sodium or  reduced-sodium). Angie Fava. Olives. Fruits Canned fruit in a light or heavy syrup. Fried fruit. Fruit in cream or butter sauce. Meat and other protein foods Fatty cuts of meat. Ribs. Fried meat. Berniece Salines. Sausage. Bologna and other processed lunch meats. Salami. Fatback. Hotdogs. Bratwurst. Salted nuts and seeds. Canned beans with added salt. Canned or smoked fish. Whole eggs or egg yolks. Chicken or [place] with skin. Dairy Whole or 2% milk, cream, and half-and-half. Whole or full-fat cream cheese. Whole-fat or sweetened yogurt. Full-fat cheese. Nondairy creamers. Whipped toppings. Processed cheese and cheese spreads. Fats and oils Butter. Stick margarine. Lard. Shortening. Ghee. Bacon fat. Tropical oils, such as coconut, palm kernel, or palm oil. Seasoning and other foods Salted popcorn and pretzels. Onion salt, garlic salt, seasoned salt, table salt, and sea salt. Worcestershire sauce. Tartar sauce. Barbecue sauce. Teriyaki sauce. Soy sauce, including reduced-sodium. Steak sauce. Canned and packaged gravies. Fish sauce. Oyster sauce. Cocktail sauce. Horseradish that you find on the shelf. Ketchup. Mustard. Meat flavorings and tenderizers. Bouillon cubes. Hot sauce and Tabasco sauce. Premade or packaged marinades. Premade or packaged taco seasonings. Relishes. Regular salad dressings. Where to find more information:  National Heart, Lung, and Reading: https://wilson-eaton.com/  American Heart Association: www.heart.org Summary  The DASH eating plan is a healthy eating plan that has been shown to reduce high blood pressure (hypertension). It may also reduce your risk for type 2 diabetes, heart disease, and stroke.  With the DASH eating plan, you should limit salt (sodium) intake to 2,300 mg a day. If you have hypertension, you may need to reduce your sodium intake to 1,500 mg a day.  When on the DASH eating plan, aim to eat more fresh fruits and vegetables, whole grains, lean proteins, low-fat  dairy, and heart-healthy fats.  Work with your health care provider or diet and nutrition specialist (dietitian) to adjust your eating plan to your individual calorie needs. This information is not intended to replace advice given to you by your health care provider. Make sure you discuss any questions you have with your health care provider. Document Released: 05/28/2011 Document Revised: 06/01/2016 Document Reviewed: 06/01/2016 Elsevier Interactive Patient Education  [employer].

## 2018-02-23 NOTE — Progress Notes (Signed)
   Subjective:    Patient ID: Peggy Foster, female    DOB: [birth year], 42 y.o.   MRN: [id number]  HPI Chief Complaint  Patient presents with  . 8 week follow-up    8 week follow-up on htn and anxiety   She is here for a 2 month follow up on HTN and anxiety.   Reports good compliance with Lisinopril-HCTZ. States she has a horrible dry cough that started in early August.  Denies having any sneezing, itchy watery eyes or any other URI symptoms. No fever, chills, chest pain, DOE.  BP at home has been 130s-150s/ 80-90.   Diet is poor.  Exercise- none   She is interested in weight loss.  Started on sertraline and states she is "doing great".  Anxiety is not an issue for her any longer.  Reviewed allergies, medications, past medical, surgical, family, and social history.    Review of Systems Pertinent positives and negatives in the history of present illness.     Objective:   Physical Exam BP 140/90   Pulse 81   Wt 270 lb 6.4 oz (122.7 kg)   BMI 41.11 kg/m   Alert and in no distress.  Pharyngeal area is normal. Neck is supple without adenopathy or thyromegaly. Cardiac exam shows a regular sinus rhythm without murmurs or gallops. Lungs are clear to auscultation.  Extremities without edema, intact.  Skin is warm and dry, no pallor or rash..       Assessment & Plan:  Essential hypertension - Plan: losartan-hydrochlorothiazide (HYZAAR) 50-12.5 MG tablet  Morbid obesity (HCC)  Anxiety  Cough due to ACE inhibitor  Dry cough started shortly after lisinopril initiated.  We will switch her to losartan/HCTZ.  Discussed that her blood pressure has not been well controlled.  We will see how she does on the new medication.  She will continue checking her blood pressure at home.  DASH diet handout given.  In-depth counseling on healthy diet including cutting back on portion sizes, carbohydrates and calories.  She will start using a free app called my fitness pal and increase her  physical activity. We also discussed the possibility of weight loss medications as a Saxenda.  She may check with her insurance regarding this.  We also discussed the possibility of a weight loss study with [facility] and she would like to receive a phone call from them.  Anxiety is no longer an issue. Continue on sertraline. She is happy with this medication.  Follow up in 4-6 weeks for CPE and hypertension follow-up

## 2018-02-24 MED ORDER — LOSARTAN POTASSIUM 50 MG PO TABS: 50.0000 mg | ORAL_TABLET | Freq: Every day | ORAL | 1 refills | Status: AC

## 2018-02-24 MED ORDER — HYDROCHLOROTHIAZIDE 12.5 MG PO TABS: 12.5000 mg | ORAL_TABLET | Freq: Every day | ORAL | 1 refills | Status: AC

## 2018-02-24 NOTE — Telephone Encounter (Signed)
done

## 2018-02-24 NOTE — Telephone Encounter (Signed)
Ok to separate it

## 2018-02-24 NOTE — Telephone Encounter (Signed)
Please advise 

## 2018-03-08 ENCOUNTER — Encounter: Payer: Self-pay | Admitting: Family Medicine

## 2018-03-23 ENCOUNTER — Ambulatory Visit (INDEPENDENT_AMBULATORY_CARE_PROVIDER_SITE_OTHER): Payer: BLUE CROSS/BLUE SHIELD | Admitting: Family Medicine

## 2018-03-23 ENCOUNTER — Encounter: Payer: Self-pay | Admitting: Family Medicine

## 2018-03-23 ENCOUNTER — Other Ambulatory Visit (HOSPITAL_COMMUNITY)
Admission: RE | Admit: 2018-03-23 | Discharge: 2018-03-23 | Disposition: A | Discharge status: Home / Self Care | Payer: BLUE CROSS/BLUE SHIELD | Source: Ambulatory Visit | Attending: Family Medicine | Admitting: Family Medicine

## 2018-03-23 VITALS — BP 130/90 | HR 70 | Ht 67.5 in | Wt 264.8 lb

## 2018-03-23 DIAGNOSIS — Z0000 Encounter for general adult medical examination without abnormal findings: Secondary | ICD-10-CM | POA: Diagnosis not present

## 2018-03-23 DIAGNOSIS — Z23 Encounter for immunization: Secondary | ICD-10-CM

## 2018-03-23 DIAGNOSIS — I10 Essential (primary) hypertension: Secondary | ICD-10-CM

## 2018-03-23 DIAGNOSIS — Z124 Encounter for screening for malignant neoplasm of cervix: Secondary | ICD-10-CM | POA: Diagnosis not present

## 2018-03-23 DIAGNOSIS — Z113 Encounter for screening for infections with a predominantly sexual mode of transmission: Secondary | ICD-10-CM

## 2018-03-23 DIAGNOSIS — F419 Anxiety disorder, unspecified: Secondary | ICD-10-CM | POA: Diagnosis not present

## 2018-03-23 DIAGNOSIS — N889 Noninflammatory disorder of cervix uteri, unspecified: Secondary | ICD-10-CM

## 2018-03-23 DIAGNOSIS — E7800 Pure hypercholesterolemia, unspecified: Secondary | ICD-10-CM

## 2018-03-23 DIAGNOSIS — Z1239 Encounter for other screening for malignant neoplasm of breast: Secondary | ICD-10-CM

## 2018-03-23 LAB — POCT URINALYSIS DIP (PROADVANTAGE DEVICE)
Bilirubin, UA: NEGATIVE
Glucose, UA: NEGATIVE mg/dL
Ketones, POC UA: NEGATIVE mg/dL
LEUKOCYTES UA: NEGATIVE
Nitrite, UA: NEGATIVE
PROTEIN UA: NEGATIVE mg/dL
SPECIFIC GRAVITY, URINE: 1.015
UUROB: NEGATIVE
pH, UA: 7.5 (ref 5.0–8.0)

## 2018-03-23 NOTE — Progress Notes (Signed)
Subjective:    Patient ID: Peggy Foster, female    DOB: 08-16-1975, 42 y.o.   MRN: [id number]  HPI Chief Complaint  Patient presents with  . cpe    fasting cpe, follow-up on HTN, gets eye checked yearly. flu shot today. will set up with obgyn   She is fairly new to the practice and here for a complete physical exam. Previous medical care: moved from [place] in 2012.  Last CPE: Dr. Criss Rosales   Other providers: None   Anxiety is well managed. Sertraline daily and no side effects. Planning to start seeing a counselor.  HTN- BP at home has been variable. Some readings in normal range and others with diastolic in upper 16L to 90.  She is aware that her LDL is elevated.   Social history: Lives with daughter who is age 41, works at [employer]  Denies smoking, drinking alcohol, drug use  Diet: eating out less. Cutting back on fried foods. States she is overeating.  Excerise: walking around the neighborhood.   Immunizations: needs Tdap and flu shots.   Health maintenance:  Mammogram: never  Colonoscopy: never  Last Pap Smear: 2012 and normal per patient. No history of abnormal pap smear. Periods are regular. No concerns. No birth control and not currently sexually active. No STD history. Reports IUD removed in 2012.  Last Menstrual cycle: last week  Last Dental Exam: every 6 months  Last Eye Exam: last year   Wears seatbelt always, uses sunscreen, smoke detectors in home and functioning, does not text while driving and feels safe in home environment.   Reviewed allergies, medications, past medical, surgical, family, and social history.   Review of Systems Review of Systems Constitutional: -fever, -chills, -sweats, -unexpected weight change,-fatigue ENT: -runny nose, -ear pain, -sore throat Cardiology:  -chest pain, -palpitations, -edema Respiratory: -cough, -shortness of breath, -wheezing Gastroenterology: -abdominal pain, -nausea, -vomiting, -diarrhea, -constipation    Hematology: -bleeding or bruising problems Musculoskeletal: -arthralgias, -myalgias, -joint swelling, -back pain Ophthalmology: -vision changes Urology: -dysuria, -difficulty urinating, -hematuria, -urinary frequency, -urgency Neurology: -headache, -weakness, -tingling, -numbness       Objective:   Physical Exam BP 130/90   Pulse 70   Ht 5' 7.5" (1.715 m)   Wt 264 lb 12.8 oz (120.1 kg)   BMI 40.86 kg/m   General Appearance:    Alert, cooperative, no distress, appears stated age  Head:    Normocephalic, without obvious abnormality, atraumatic  Eyes:    PERRL, conjunctiva/corneas clear, EOM's intact, fundi    benign  Ears:    Normal TM's and external ear canals  Nose:   Nares normal, mucosa normal, no drainage or sinus   tenderness  Throat:   Lips, mucosa, and tongue normal; teeth and gums normal  Neck:   Supple, no lymphadenopathy;  thyroid:  no   enlargement/tenderness/nodules; no carotid   bruit or JVD  Back:    Spine nontender, no curvature, ROM normal, no CVA     tenderness  Lungs:     Clear to auscultation bilaterally without wheezes, rales or     ronchi; respirations unlabored  Chest Wall:    No tenderness or deformity   Heart:    Regular rate and rhythm, S1 and S2 normal, no murmur, rub   or gallop  Breast Exam:    Not done. Mammogram ordered.   Abdomen:     Soft, non-tender, nondistended, normoactive bowel sounds,    no masses, no hepatosplenomegaly  Genitalia:    Normal  external genitalia without lesions.  BUS and vagina normal; cervix without lesions however there are 2 black strings visible, no cervical motion tenderness. No abnormal vaginal discharge.  Uterus and adnexa not enlarged, nontender, no masses.  Pap performed, chaperone present.   Rectal:    Not performed due to age<40 and no related complaints  Extremities:   No clubbing, cyanosis or edema  Pulses:   2+ and symmetric all extremities  Skin:   Skin color, texture, turgor normal, no rashes or lesions   Lymph nodes:   Cervical, supraclavicular, and axillary nodes normal  Neurologic:   CNII-XII intact, normal strength, sensation and gait; reflexes 2+ and symmetric throughout          Psych:   Normal mood, affect, hygiene and grooming.     Urinalysis dipstick: trace blood (specimen obtained post pap smear)      Assessment & Plan:  Routine general medical examination at a health care facility - Plan: POCT Urinalysis DIP (Proadvantage Device)  Anxiety  Morbid obesity ([place])  Essential hypertension  Elevated LDL cholesterol level  Screening for cervical cancer - Plan: Cytology - PAP  Screening for breast cancer - Plan: MM DIGITAL SCREENING BILATERAL  Need for diphtheria-tetanus-pertussis (Tdap) vaccine - Plan: Tdap vaccine greater than or equal to 7yo IM  Screen for STD (sexually transmitted disease) - Plan: HIV Antibody (routine testing w rflx), RPR  Needs flu shot - Plan: Flu Vaccine QUAD 36+ mos IM  Abnormal cervix finding - Plan: US Pelvis Complete, US PELVIC COMPLETE WITH TRANSVAGINAL, CANCELED: US Transvaginal Non-OB  She appears to be doing well overall.  We did talk about her weight quite a bit and how to make healthy lifestyle changes in order to lose weight.  She seems motivated.  Specifically discussed cutting back on carbohydrates and portion sizes and increasing her physical activity level.  Discussed using a free app such as my fitness pal to track her daily calories.  Challenged her to lose 10 pounds over the next 3 months. Anxiety appears to be well controlled and she will continue on sertraline.  Encouraged her to see a counselor Hypertension-reports good daily compliance and no side effects.  Blood pressure is not quite to goal and she is aware of this.  She will continue checking her blood pressure at home and cut back on sodium and increase physical activity.  Discussed the possibility of increasing medication if this is not coming down.  She would prefer to work on  lifestyle over the next 3 months to try and get this under better control Elevated LDL-discussed diet and exercise for this. Pelvic exam findings showed to black strings at the cervical loss and patient is adamant that she had a IUD removed in 2012.  I will send her for an ultrasound to determine if an IUD is in place. STD screening done per patient request Pap smear done and chaperone present She will call and schedule her first screening mammogram Tdap and flu shot given.  Counseling was done on all components of the vaccines. Follow-up pending labs and ultrasound.

## 2018-03-23 NOTE — Patient Instructions (Addendum)
Continue checking your BP at home. Check nutrition labels and cut back on sodium. Increase physical activity.  Continue on your current medications.   Try to cut back on portion sizes and carbohydrates (potatoes, rice, pasta, bread).  I challenge you to lose 10 lbs over the next 3 months.  Try using a free app such as My Fitness Pal to track your calories.   Call and schedule your mammogram.  You have a pelvic ultrasound scheduled for next week.   We will call you with results.   Follow up in 3 months for your blood pressure.    Preventative Care for Adults - Female      MAINTAIN REGULAR HEALTH EXAMS:  A routine yearly physical is a good way to check in with your primary care provider about your health and preventive screening. It is also an opportunity to share updates about your health and any concerns you have, and receive a thorough all-over exam.   Most health insurance companies pay for at least some preventative services.  Check with your health plan for specific coverages.  WHAT PREVENTATIVE SERVICES DO WOMEN NEED?  Adult women should have their weight and blood pressure checked regularly.   Women age 86 and older should have their cholesterol levels checked regularly.  Women should be screened for cervical cancer with a Pap smear and pelvic exam beginning at either age 33, or 3 years after they become sexually activity.    Breast cancer screening generally begins at age 9 with a mammogram and breast exam by your primary care provider.    Beginning at age 62 and continuing to age 50, women should be screened for colorectal cancer.  Certain people may need continued testing until age 74.  Updating vaccinations is part of preventative care.  Vaccinations help protect against diseases such as the flu.  Osteoporosis is a disease in which the bones lose minerals and strength as we age. Women ages 53 and over should discuss this with their caregivers, as should women after  menopause who have other risk factors.  Lab tests are generally done as part of preventative care to screen for anemia and blood disorders, to screen for problems with the kidneys and liver, to screen for bladder problems, to check blood sugar, and to check your cholesterol level.  Preventative services generally include counseling about diet, exercise, avoiding tobacco, drugs, excessive alcohol consumption, and sexually transmitted infections.    GENERAL RECOMMENDATIONS FOR GOOD HEALTH:  Healthy diet:  Eat a variety of foods, including fruit, vegetables, animal or vegetable protein, such as meat, fish, chicken, and eggs, or beans, lentils, tofu, and grains, such as rice.  Drink plenty of water daily.  Decrease saturated fat in the diet, avoid lots of red meat, processed foods, sweets, fast foods, and fried foods.  Exercise:  Aerobic exercise helps maintain good heart health. At least 30-40 minutes of moderate-intensity exercise is recommended. For example, a brisk walk that increases your heart rate and breathing. This should be done on most days of the week.   Find a type of exercise or a variety of exercises that you enjoy so that it becomes a part of your daily life.  Examples are running, walking, swimming, water aerobics, and biking.  For motivation and support, explore group exercise such as aerobic class, spin class, Zumba, Yoga,or  martial arts, etc.    Set exercise goals for yourself, such as a certain weight goal, walk or run in a race such as  a 5k walk/run.  Speak to your primary care provider about exercise goals.  Disease prevention:  If you smoke or chew tobacco, find out from your caregiver how to quit. It can literally save your life, no matter how long you have been a tobacco user. If you do not use tobacco, never begin.   Maintain a healthy diet and normal weight. Increased weight leads to problems with blood pressure and diabetes.   The Body Mass Index or BMI is a  way of measuring how much of your body is fat. Having a BMI above 27 increases the risk of heart disease, diabetes, hypertension, stroke and other problems related to obesity. Your caregiver can help determine your BMI and based on it develop an exercise and dietary program to help you achieve or maintain this important measurement at a healthful level.  High blood pressure causes heart and blood vessel problems.  Persistent high blood pressure should be treated with medicine if weight loss and exercise do not work.   Fat and cholesterol leaves deposits in your arteries that can block them. This causes heart disease and vessel disease elsewhere in your body.  If your cholesterol is found to be high, or if you have heart disease or certain other medical conditions, then you may need to have your cholesterol monitored frequently and be treated with medication.   Ask if you should have a cardiac stress test if your history suggests this. A stress test is a test done on a treadmill that looks for heart disease. This test can find disease prior to there being a problem.  Menopause can be associated with physical symptoms and risks. Hormone replacement therapy is available to decrease these. You should talk to your caregiver about whether starting or continuing to take hormones is right for you.   Osteoporosis is a disease in which the bones lose minerals and strength as we age. This can result in serious bone fractures. Risk of osteoporosis can be identified using a bone density scan. Women ages 22 and over should discuss this with their caregivers, as should women after menopause who have other risk factors. Ask your caregiver whether you should be taking a calcium supplement and Vitamin D, to reduce the rate of osteoporosis.   Avoid drinking alcohol in excess (more than two drinks per day).  Avoid use of street drugs. Do not share needles with anyone. Ask for professional help if you need assistance or  instructions on stopping the use of alcohol, cigarettes, and/or drugs.  Brush your teeth twice a day with fluoride toothpaste, and floss once a day. Good oral hygiene prevents tooth decay and gum disease. The problems can be painful, unattractive, and can cause other health problems. Visit your dentist for a routine oral and dental check up and preventive care every 6-12 months.   Look at your skin regularly.  Use a mirror to look at your back. Notify your caregivers of changes in moles, especially if there are changes in shapes, colors, a size larger than a pencil eraser, an irregular border, or development of new moles.  Safety:  Use seatbelts 100% of the time, whether driving or as a passenger.  Use safety devices such as hearing protection if you work in environments with loud noise or significant background noise.  Use safety glasses when doing any work that could send debris in to the eyes.  Use a helmet if you ride a bike or motorcycle.  Use appropriate safety gear for contact  sports.  Talk to your caregiver about gun safety.  Use sunscreen with a SPF (or skin protection factor) of 15 or greater.  Lighter skinned people are at a greater risk of skin cancer. Don't forget to also wear sunglasses in order to protect your eyes from too much damaging sunlight. Damaging sunlight can accelerate cataract formation.   Practice safe sex. Use condoms. Condoms are used for birth control and to help reduce the spread of sexually transmitted infections (or STIs).  Some of the STIs are gonorrhea (the clap), chlamydia, syphilis, trichomonas, herpes, HPV (human papilloma virus) and HIV (human immunodeficiency virus) which causes AIDS. The herpes, HIV and HPV are viral illnesses that have no cure. These can result in disability, cancer and death.   Keep carbon monoxide and smoke detectors in your home functioning at all times. Change the batteries every 6 months or use a model that plugs into the wall.    Vaccinations:  Stay up to date with your tetanus shots and other required immunizations. You should have a booster for tetanus every 10 years. Be sure to get your flu shot every year, since 5%-20% of the [place] population comes down with the flu. The flu vaccine changes each year, so being vaccinated once is not enough. Get your shot in the fall, before the flu season peaks.   Other vaccines to consider:  Human Papilloma Virus or HPV causes cancer of the cervix, and other infections that can be transmitted from person to person. There is a vaccine for HPV, and females should get immunized between the ages of 15 and 62. It requires a series of 3 shots.   Pneumococcal vaccine to protect against certain types of pneumonia.  This is normally recommended for adults age 39 or older.  However, adults younger than 40 years old with certain underlying conditions such as diabetes, heart or lung disease should also receive the vaccine.  Shingles vaccine to protect against Varicella Zoster if you are older than age 98, or younger than 40 years old with certain underlying illness.  Hepatitis A vaccine to protect against a form of infection of the liver by a virus acquired from food.  Hepatitis B vaccine to protect against a form of infection of the liver by a virus acquired from blood or body fluids, particularly if you work in health care.  If you plan to travel internationally, check with your local health department for specific vaccination recommendations.  Cancer Screening:  Breast cancer screening is essential to preventive care for women. All women age 54 and older should perform a breast self-exam every month. At age 61 and older, women should have their caregiver complete a breast exam each year. Women at ages 97 and older should have a mammogram (x-ray film) of the breasts. Your caregiver can discuss how often you need mammograms.    Cervical cancer screening includes taking a Pap smear (sample  of cells examined under a microscope) from the cervix (end of the uterus). It also includes testing for HPV (Human Papilloma Virus, which can cause cervical cancer). Screening and a pelvic exam should begin at age 21, or 3 years after a woman becomes sexually active. Screening should occur every year, with a Pap smear but no HPV testing, up to age 6. After age 44, you should have a Pap smear every 3 years with HPV testing, if no HPV was found previously.   Most routine colon cancer screening begins at the age of 71. On a yearly  basis, doctors may provide special easy to use take-home tests to check for hidden blood in the stool. Sigmoidoscopy or colonoscopy can detect the earliest forms of colon cancer and is life saving. These tests use a small camera at the end of a tube to directly examine the colon. Speak to your caregiver about this at age 14, when routine screening begins (and is repeated every 5 years unless early forms of pre-cancerous polyps or small growths are found).

## 2018-03-24 LAB — RPR: RPR Ser Ql: NONREACTIVE

## 2018-03-24 LAB — HIV ANTIBODY (ROUTINE TESTING W REFLEX): HIV Screen 4th Generation wRfx: NONREACTIVE

## 2018-03-25 LAB — CYTOLOGY - PAP
CHLAMYDIA, DNA PROBE: NEGATIVE
Diagnosis: NEGATIVE
HPV: NOT DETECTED
NEISSERIA GONORRHEA: NEGATIVE
TRICH (WINDOWPATH): NEGATIVE

## 2018-03-29 ENCOUNTER — Ambulatory Visit
Admission: RE | Admit: 2018-03-29 | Discharge: 2018-03-29 | Disposition: A | Discharge status: Home / Self Care | Payer: BLUE CROSS/BLUE SHIELD | Source: Ambulatory Visit | Attending: Family Medicine | Admitting: Family Medicine

## 2018-03-29 DIAGNOSIS — N889 Noninflammatory disorder of cervix uteri, unspecified: Secondary | ICD-10-CM

## 2018-03-31 ENCOUNTER — Encounter: Payer: Self-pay | Admitting: Internal Medicine

## 2018-06-27 ENCOUNTER — Ambulatory Visit: Payer: BLUE CROSS/BLUE SHIELD | Admitting: Family Medicine

## 2018-07-06 ENCOUNTER — Ambulatory Visit: Payer: BLUE CROSS/BLUE SHIELD | Admitting: Family Medicine

## 2018-07-19 ENCOUNTER — Encounter: Payer: Self-pay | Admitting: Family Medicine

## 2018-08-24 ENCOUNTER — Telehealth: Payer: Self-pay | Admitting: Family Medicine

## 2018-08-24 NOTE — Telephone Encounter (Signed)
Dismissal letter in guarantor snapshot  °

## 2019-02-02 DIAGNOSIS — Z0289 Encounter for other administrative examinations: Secondary | ICD-10-CM

## 2019-02-08 ENCOUNTER — Telehealth: Payer: Self-pay | Admitting: *Deleted

## 2019-02-08 NOTE — Telephone Encounter (Signed)
The form faxed to [employer] on 02/08/19.

## 2019-03-27 ENCOUNTER — Encounter: Payer: BLUE CROSS/BLUE SHIELD | Admitting: Family Medicine

## 2019-10-23 IMAGING — US US PELVIS COMPLETE TRANSABD/TRANSVAG
1 series · 13 of 25 positions shown · non-contrast
Comparison: None

CLINICAL DATA: Abnormal findings on pelvic exam.



[Series 1: us pelvis complete transabd/transvag · 0.26mm/px · 13 of 78 slices shown]
[im 1/78]
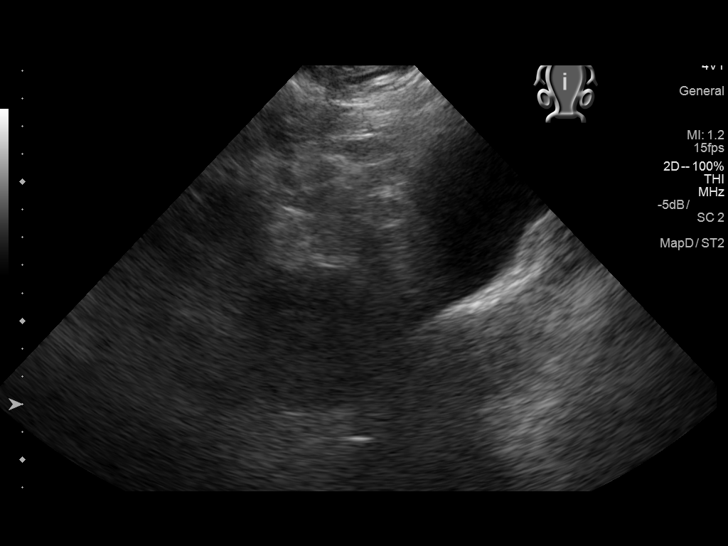
[im 7/78]
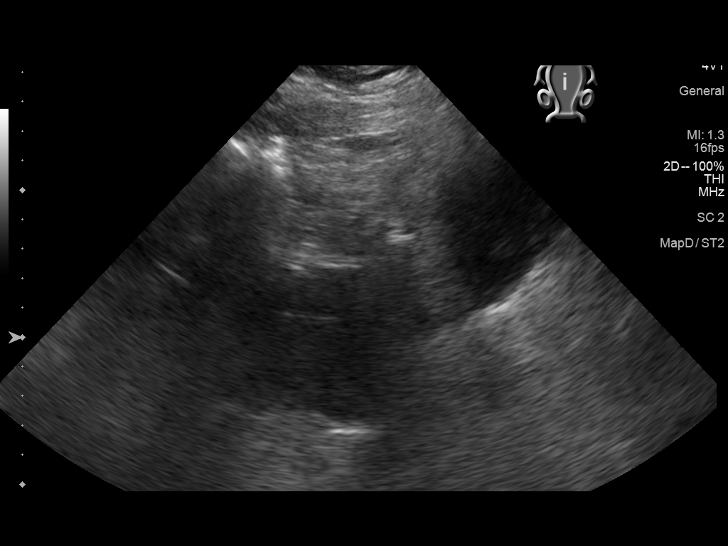
[im 13/78]
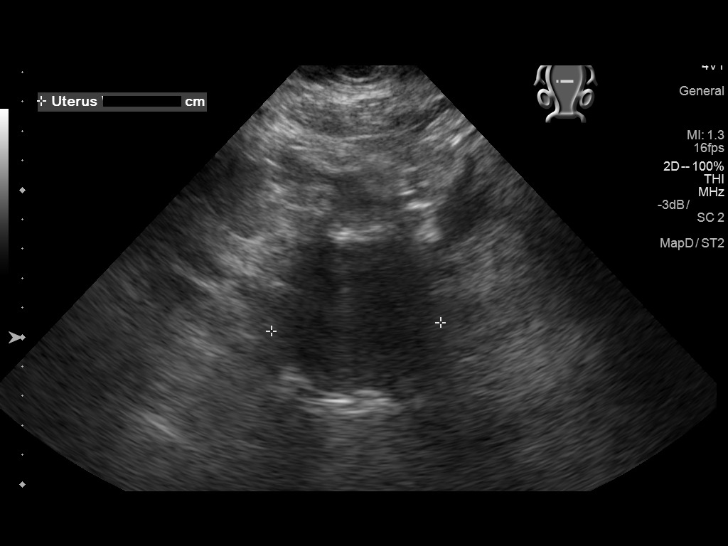
[im 20/78]
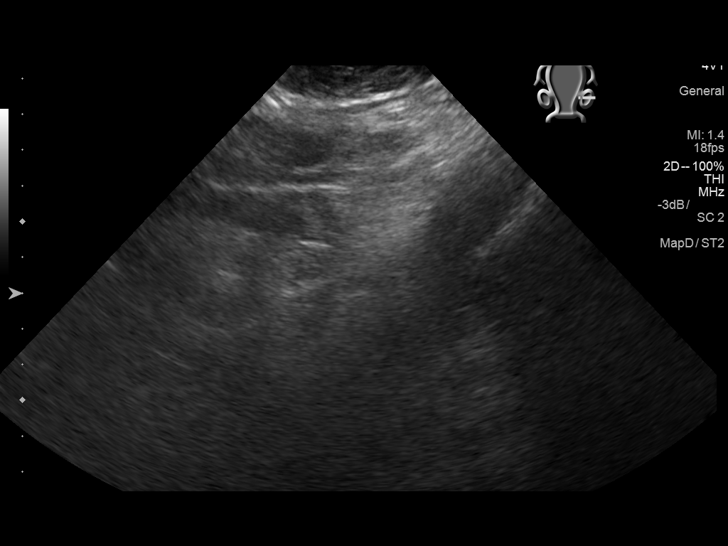
[im 26/78]
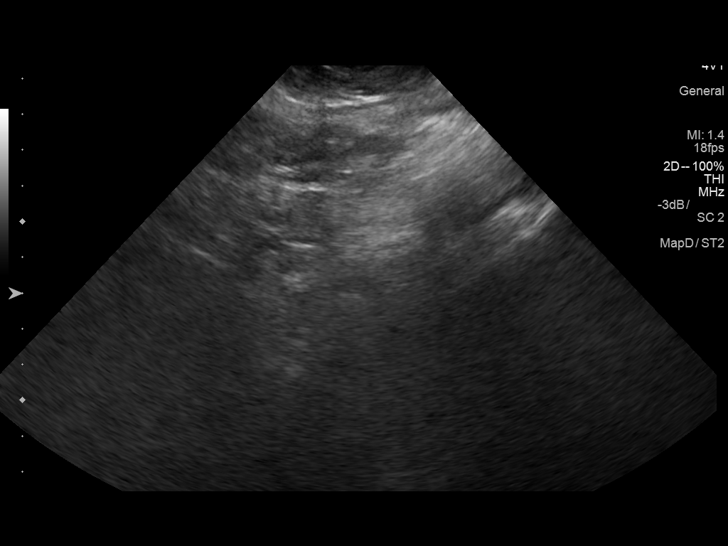
[im 33/78]
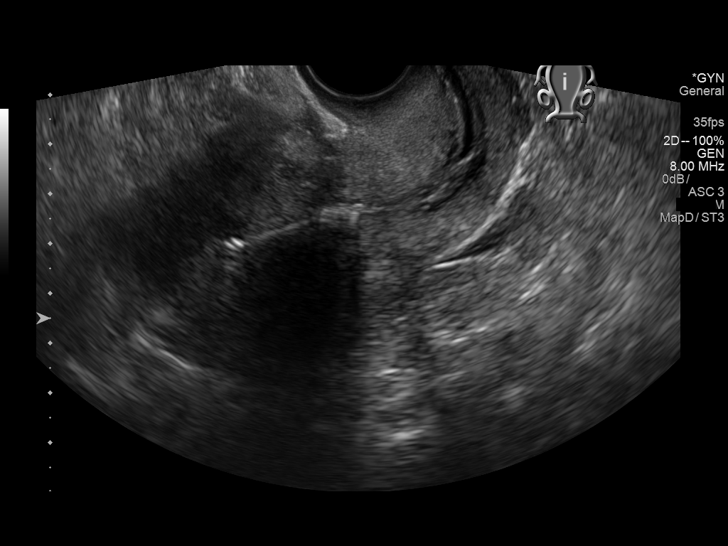
[im 39/78]
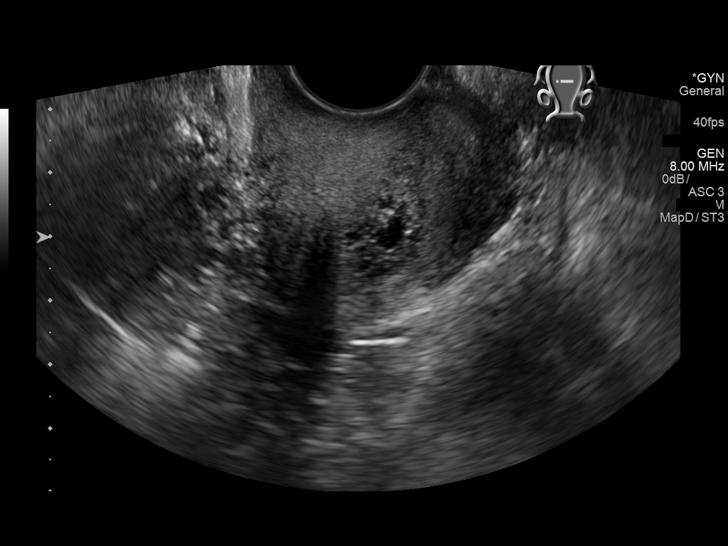
[im 45/78]
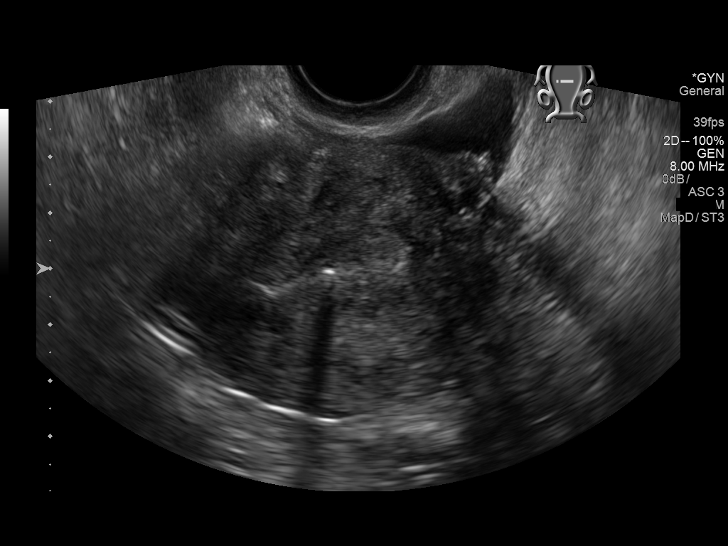
[im 52/78]
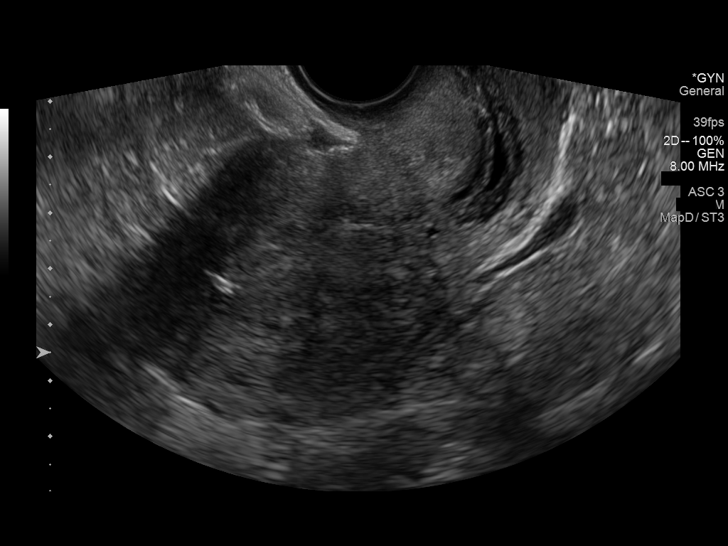
[im 58/78]
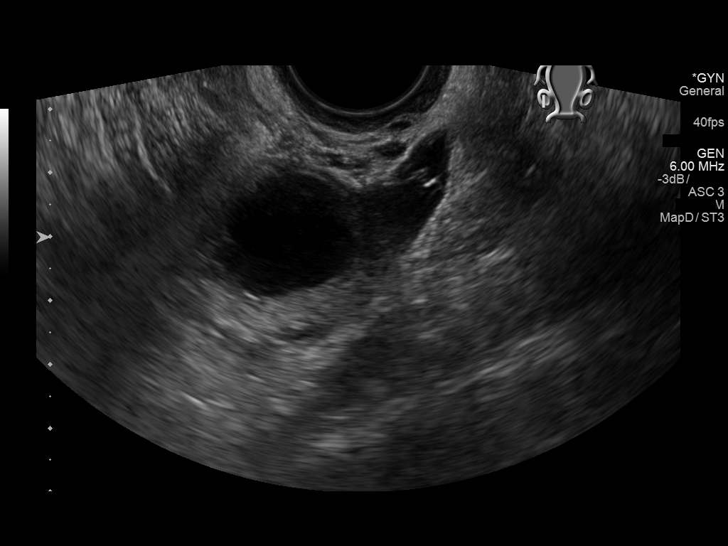
[im 65/78]
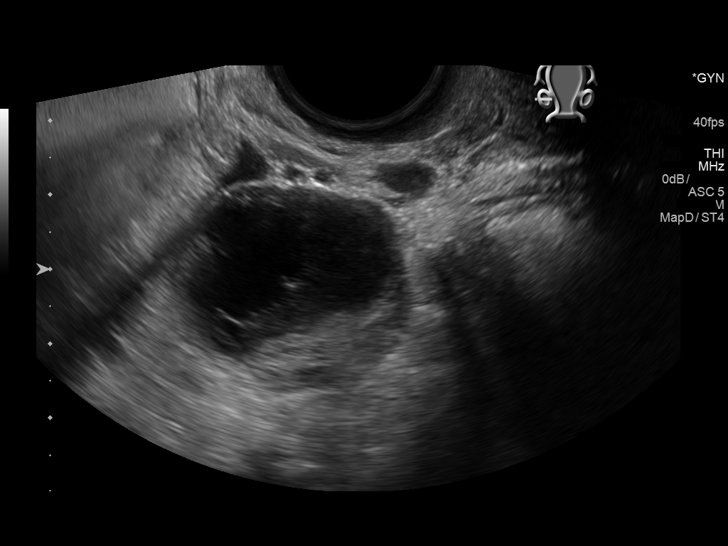
[im 71/78]
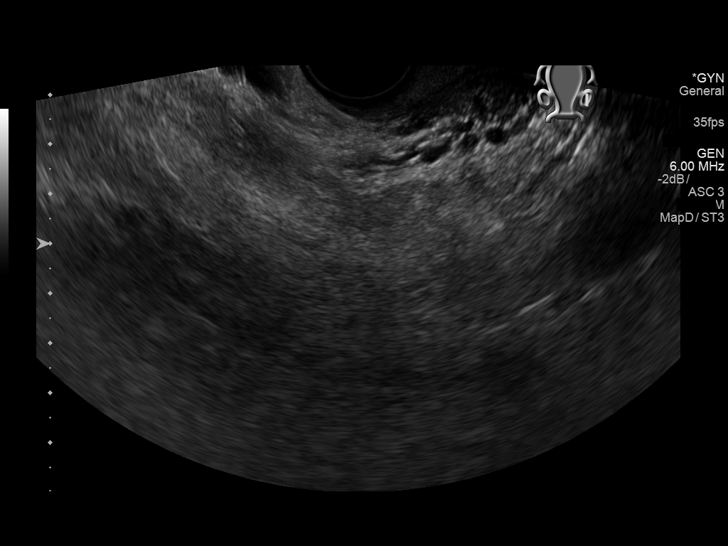
[im 78/78]
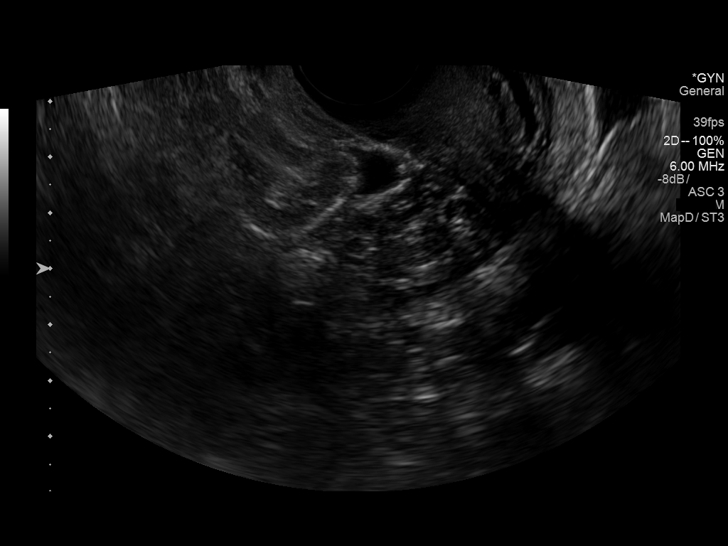

[13 of 25 positions shown; findings below may reference images not displayed]

FINDINGS: Uterus

Measurements: 8.7 x 4.9 x 5.8 cm. Heterogeneous appearance of the
myometrium. Possible anterior mid uterine body myometrial mass,
poorly visualized.

Endometrium

Thickness: 9 mm.  There is an IUD in the endometrial canal.

Right ovary

Measurements: 3.9 x 2.7 x 3.1 cm. There is a complex cystic
structure on the right ovary which measures 2.4 x 1.7 x 3.2 cm

Left ovary

Not seen due to overlying bowel gas..

Other findings

Trace free pelvic fluid.
IMPRESSION: Heterogeneous appearance of the uterus with a suggestion of anterior
uterine body myometrial mass, poorly visualized.

IUD within the endometrial canal.

3.2 cm complex cystic structure on the right ovary. This may
represent a degenerating or hemorrhagic ovarian cyst, however cystic
neoplasm cannot be entirely excluded. Follow-up with pelvic
ultrasound in 6-8 weeks is recommended with attention to the right
adnexa.

Nonvisualization of the left ovary.
